# Patient Record
Sex: Male | Born: 1980 | Race: Black or African American | Hispanic: No | Marital: Single | Smoking: Never smoker
Health system: Southern US, Community
[De-identification: ages and names within clinical notes are randomized; demographics above are authoritative.]

## PROBLEM LIST (undated history)

## (undated) DIAGNOSIS — J45909 Unspecified asthma, uncomplicated: Secondary | ICD-10-CM

## (undated) DIAGNOSIS — E119 Type 2 diabetes mellitus without complications: Secondary | ICD-10-CM

## (undated) DIAGNOSIS — I10 Essential (primary) hypertension: Secondary | ICD-10-CM

---

## 2007-05-12 ENCOUNTER — Encounter: Admission: RE | Admit: 2007-05-12 | Discharge: 2007-05-12 | Payer: Self-pay | Admitting: Family Medicine

## 2008-10-01 ENCOUNTER — Ambulatory Visit (HOSPITAL_COMMUNITY): Admission: RE | Admit: 2008-10-01 | Discharge: 2008-10-01 | Payer: Self-pay | Admitting: Family Medicine

## 2010-09-10 ENCOUNTER — Encounter: Payer: Self-pay | Admitting: Family Medicine

## 2021-06-04 DIAGNOSIS — U071 COVID-19: Secondary | ICD-10-CM | POA: Diagnosis not present

## 2021-06-04 DIAGNOSIS — Z1159 Encounter for screening for other viral diseases: Secondary | ICD-10-CM | POA: Diagnosis not present

## 2021-08-07 DIAGNOSIS — F4323 Adjustment disorder with mixed anxiety and depressed mood: Secondary | ICD-10-CM | POA: Diagnosis not present

## 2021-08-15 DIAGNOSIS — N529 Male erectile dysfunction, unspecified: Secondary | ICD-10-CM | POA: Diagnosis not present

## 2021-08-15 DIAGNOSIS — E119 Type 2 diabetes mellitus without complications: Secondary | ICD-10-CM | POA: Diagnosis not present

## 2021-08-15 DIAGNOSIS — I10 Essential (primary) hypertension: Secondary | ICD-10-CM | POA: Diagnosis not present

## 2021-08-15 DIAGNOSIS — E291 Testicular hypofunction: Secondary | ICD-10-CM | POA: Diagnosis not present

## 2021-08-24 DIAGNOSIS — N529 Male erectile dysfunction, unspecified: Secondary | ICD-10-CM | POA: Diagnosis not present

## 2021-08-24 DIAGNOSIS — I10 Essential (primary) hypertension: Secondary | ICD-10-CM | POA: Diagnosis not present

## 2021-08-24 DIAGNOSIS — E119 Type 2 diabetes mellitus without complications: Secondary | ICD-10-CM | POA: Diagnosis not present

## 2021-08-24 DIAGNOSIS — E291 Testicular hypofunction: Secondary | ICD-10-CM | POA: Diagnosis not present

## 2021-09-26 DIAGNOSIS — E291 Testicular hypofunction: Secondary | ICD-10-CM | POA: Diagnosis not present

## 2021-09-26 DIAGNOSIS — E559 Vitamin D deficiency, unspecified: Secondary | ICD-10-CM | POA: Diagnosis not present

## 2021-09-26 DIAGNOSIS — N529 Male erectile dysfunction, unspecified: Secondary | ICD-10-CM | POA: Diagnosis not present

## 2021-09-26 DIAGNOSIS — E119 Type 2 diabetes mellitus without complications: Secondary | ICD-10-CM | POA: Diagnosis not present

## 2021-10-04 DIAGNOSIS — E1165 Type 2 diabetes mellitus with hyperglycemia: Secondary | ICD-10-CM | POA: Diagnosis not present

## 2021-10-09 ENCOUNTER — Other Ambulatory Visit: Payer: Self-pay | Admitting: Family Medicine

## 2021-10-10 ENCOUNTER — Other Ambulatory Visit: Payer: Self-pay | Admitting: Family Medicine

## 2021-10-10 DIAGNOSIS — E221 Hyperprolactinemia: Secondary | ICD-10-CM

## 2021-10-17 ENCOUNTER — Ambulatory Visit
Admission: RE | Admit: 2021-10-17 | Discharge: 2021-10-17 | Disposition: A | Discharge status: Home / Self Care | Payer: Self-pay | Source: Ambulatory Visit | Attending: Family Medicine | Admitting: Family Medicine

## 2021-10-17 ENCOUNTER — Other Ambulatory Visit: Payer: Self-pay

## 2021-10-17 DIAGNOSIS — D352 Benign neoplasm of pituitary gland: Secondary | ICD-10-CM | POA: Diagnosis not present

## 2021-10-17 DIAGNOSIS — E221 Hyperprolactinemia: Secondary | ICD-10-CM

## 2021-10-17 DIAGNOSIS — E237 Disorder of pituitary gland, unspecified: Secondary | ICD-10-CM | POA: Diagnosis not present

## 2021-10-17 MED ORDER — GADOBENATE DIMEGLUMINE 529 MG/ML IV SOLN
10.0000 mL | Freq: Once | INTRAVENOUS | Status: AC | PRN
  Administered 2021-10-17: 10 mL via INTRAVENOUS

## 2021-10-22 ENCOUNTER — Other Ambulatory Visit: Payer: Self-pay

## 2021-10-22 ENCOUNTER — Emergency Department
Admission: EM | Admit: 2021-10-22 | Discharge: 2021-10-22 | Disposition: A | Discharge status: Home / Self Care | Payer: BC Managed Care – PPO | Source: Home / Self Care | Attending: Family Medicine | Admitting: Family Medicine

## 2021-10-22 ENCOUNTER — Emergency Department (INDEPENDENT_AMBULATORY_CARE_PROVIDER_SITE_OTHER): Payer: BC Managed Care – PPO

## 2021-10-22 ENCOUNTER — Encounter: Payer: Self-pay | Admitting: Emergency Medicine

## 2021-10-22 DIAGNOSIS — R0781 Pleurodynia: Secondary | ICD-10-CM

## 2021-10-22 DIAGNOSIS — S20212A Contusion of left front wall of thorax, initial encounter: Secondary | ICD-10-CM

## 2021-10-22 DIAGNOSIS — R051 Acute cough: Secondary | ICD-10-CM | POA: Diagnosis not present

## 2021-10-22 DIAGNOSIS — W19XXXA Unspecified fall, initial encounter: Secondary | ICD-10-CM | POA: Diagnosis not present

## 2021-10-22 HISTORY — DX: Essential (primary) hypertension: I10

## 2021-10-22 HISTORY — DX: Unspecified asthma, uncomplicated: J45.909

## 2021-10-22 HISTORY — DX: Type 2 diabetes mellitus without complications: E11.9

## 2021-10-22 MED ORDER — IBUPROFEN 800 MG PO TABS: 800.0000 mg | ORAL_TABLET | Freq: Three times a day (TID) | ORAL | 0 refills | Status: DC

## 2021-10-22 MED ORDER — BENZONATATE 200 MG PO CAPS: 200.0000 mg | ORAL_CAPSULE | Freq: Three times a day (TID) | ORAL | 0 refills | Status: DC | PRN

## 2021-10-22 NOTE — ED Provider Notes (Signed)
?[facility] ? ? ? ?CSN: [id number] ?Arrival date & time: 10/22/21  1309 ? ? ?  ? ?History   ?Chief Complaint ?Chief Complaint  ?Patient presents with  ? Abdominal Pain  ? ? ?HPI ?Steven Stevenson is a 41 y.o. male.  ? ?HPI ?Patient fell while skiing.  He states that his left arm was across the front of his chest, elbow at 90 degrees, and as he felt he landed right on the left arm that pressed into his rib cage.  Ever since then he has had rib pain.  It hurts with deep breath.  Hurts with cough.  Hurts with certain movement.  He states he is worried about fracture.  Its not gotten better.  Is been like this about a week.  He has developed a cough.  He states the cough predated the fall.  No fever chills, nausea vomiting, no sputum production, no fatigue ? ?Past Medical History:  ?Diagnosis Date  ? Asthma   ? Diabetes mellitus without complication (HCC)   ? Hypertension   ? ? ?There are no problems to display for this patient. ? ? ?History reviewed. No pertinent surgical history. ? ? ? ? ?Home Medications   ? ?Prior to Admission medications   ?Medication Sig Start Date End Date Taking? Authorizing Provider  ?albuterol (VENTOLIN HFA) 108 (90 Base) MCG/ACT inhaler Inhale into the lungs. 04/03/19  Yes [provider]  ?amLODipine (NORVASC) 10 MG tablet Take 10 mg by mouth daily. 08/23/21  Yes [provider]  ?atorvastatin (LIPITOR) 10 MG tablet Take 10 mg by mouth daily. 10/22/21  Yes [provider]  ?benzonatate (TESSALON) 200 MG capsule Take 1 capsule (200 mg total) by mouth 3 (three) times daily as needed for cough. 10/22/21  Yes Eustace Moore, MD  ?Continuous Blood Gluc Sensor (DEXCOM G7 SENSOR) MISC USE AS DIRECTED MORE THAN 4 TIMES DAILY TO CHECK BLOOD SUGAR 10/09/21  Yes [provider]  ?ergocalciferol (VITAMIN D2) 1.25 MG (50000 UT) capsule Take by mouth. 08/30/21  Yes [provider]  ?FARXIGA 5 MG TABS tablet Take 5 mg by mouth daily. 10/15/21  Yes [provider]  ?glipiZIDE (GLUCOTROL) 5 MG tablet Take 1-2 tablets by mouth every morning. 09/26/21  Yes [provider]  ?ibuprofen (ADVIL) 800 MG tablet Take 1 tablet (800 mg total) by mouth 3 (three) times daily. 10/22/21  Yes Eustace Moore, MD  ?JANUVIA 100 MG tablet Take 100 mg by mouth daily. 10/15/21  Yes [provider]  ?lisinopril (ZESTRIL) 40 MG tablet Take 40 mg by mouth daily. 08/30/21  Yes [provider]  ?metFORMIN (GLUCOPHAGE-XR) 500 MG 24 hr tablet SMARTSIG:2 Tablet(s) By Mouth Morning-Evening 07/25/21  Yes [provider]  ?metoprolol succinate (TOPROL-XL) 50 MG 24 hr tablet Take by mouth. 08/30/21  Yes [provider]  ?montelukast (SINGULAIR) 10 MG tablet Take by mouth. 01/26/19  Yes [provider]  ?omeprazole (PRILOSEC) 40 MG capsule Take 1 capsule by mouth daily. 10/29/19  Yes [provider]  ?sildenafil (VIAGRA) 100 MG tablet SMARTSIG:0.5-1 Tablet(s) By Mouth As Directed 08/23/21  Yes [provider]  ? ? ?Family History ?Family History  ?Problem Relation Age of Onset  ? Hypertension Father   ? Diabetes Father   ? ? ?Social History ?Social History  ? ?Tobacco Use  ? Smoking status: Never  ? Smokeless tobacco: Never  ?Vaping Use  ? Vaping Use: Never used  ?Substance Use Topics  ? Alcohol  use: Yes  ? Drug use: Not Currently  ? ? ? ?Allergies   ?Penicillins ? ? ?Review of Systems ?Review of Systems ?See HPI ? ?Physical Exam ?Triage Vital Signs ?ED Triage Vitals  ?Enc Vitals Group  ?   BP 10/22/21 1346 118/87  ?   Pulse Rate 10/22/21 1346 83  ?   Resp 10/22/21 1346 18  ?   Temp 10/22/21 1346 98.5 ?F (36.9 ?C)  ?   Temp Source 10/22/21 1346 Oral  ?   SpO2 10/22/21 1346 96 %  ?   Weight --   ?   Height --   ?   Head Circumference --   ?   Peak Flow --   ?   Pain Score 10/22/21 1339 8  ?   Pain Loc --   ?   Pain Edu? --   ?   Excl. in GC? --   ? ?No data found. ? ?Updated Vital Signs ?BP 118/87 (BP Location: Left Arm)   Pulse 83    Temp 98.5 ?F (36.9 ?C) (Oral)   Resp 18   SpO2 96%  ?   ? ?Physical Exam ?Constitutional:   ?   General: He is not in acute distress. ?   Appearance: He is well-developed. He is not ill-appearing.  ?HENT:  ?   Head: Normocephalic and atraumatic.  ?   Nose:  ?   Comments: Mask is in place ?Eyes:  ?   Conjunctiva/sclera: Conjunctivae normal.  ?   Pupils: Pupils are equal, round, and reactive to light.  ?Cardiovascular:  ?   Rate and Rhythm: Normal rate.  ?Pulmonary:  ?   Effort: Pulmonary effort is normal. No respiratory distress.  ?Chest:  ? ? ?Abdominal:  ?   General: There is no distension.  ?   Palpations: Abdomen is soft.  ?Musculoskeletal:     ?   General: Normal range of motion.  ?   Cervical back: Normal range of motion.  ?Skin: ?   General: Skin is warm and dry.  ?Neurological:  ?   Mental Status: He is alert.  ? ? ? ?UC Treatments / Results  ?Labs ?(all labs ordered are listed, but only abnormal results are displayed) ?Labs Reviewed - No data to display ? ?EKG ? ? ?Radiology ?DG Ribs Unilateral W/Chest Left ? ?Result Date: 10/22/2021 ?CLINICAL DATA:  Left anterior lower rib pain after fall, increased pain with movement EXAM: LEFT RIBS AND CHEST - 3+ VIEW COMPARISON:  None. FINDINGS: No fracture or other bone lesions are seen involving the ribs. There is no evidence of pneumothorax or pleural effusion. Both lungs are clear. Heart size and mediastinal contours are within normal limits. IMPRESSION: Negative. Electronically Signed   By: Amie Portland M.D.   On: 10/22/2021 14:35   ? ?Procedures ?Procedures (including critical care time) ? ?Medications Ordered in UC ?Medications - No data to display ? ?Initial Impression / Assessment and Plan / UC Course  ?I have reviewed the triage vital signs and the nursing notes. ? ?Pertinent labs & imaging results that were available during my care of the patient were reviewed by me and considered in my medical decision making (see chart for details). ? ?  ? ?Final Clinical  Impressions(s) / UC Diagnoses  ? ?Final diagnoses:  ?Acute cough  ?Chest wall contusion, left, initial encounter  ? ? ? ?Discharge Instructions   ? ?  ?Take ibuprofen 3 times a day with food.  This is an  anti-inflammatory to reduce the rib pain and inflammation ?I have prescribed Tessalon to help with the cough.  You may take this 2-3 times a day ?Return as needed ? ? ?ED Prescriptions   ? ? Medication Sig Dispense Auth. Provider  ? benzonatate (TESSALON) 200 MG capsule Take 1 capsule (200 mg total) by mouth 3 (three) times daily as needed for cough. 21 capsule Eustace Moore, MD  ? ibuprofen (ADVIL) 800 MG tablet Take 1 tablet (800 mg total) by mouth 3 (three) times daily. 21 tablet Eustace Moore, MD  ? ?  ? ?PDMP not reviewed this encounter. ?  ?Eustace Moore, MD ?10/22/21 1510 ? ?

## 2021-10-22 NOTE — ED Triage Notes (Signed)
Patient presents to Urgent Care with complaints of left upper abdominal pain since 1 week ago. Patient reports going skiing and fell. When he landed on the his left upper abdomen. No pain with deep breathe. Coughing does make pain worst. Has not tried any medication.  ?

## 2021-10-22 NOTE — Discharge Instructions (Signed)
Take ibuprofen 3 times a day with food.  This is an anti-inflammatory to reduce the rib pain and inflammation ?I have prescribed Tessalon to help with the cough.  You may take this 2-3 times a day ?Return as needed ?

## 2021-11-07 DIAGNOSIS — E1165 Type 2 diabetes mellitus with hyperglycemia: Secondary | ICD-10-CM | POA: Diagnosis not present

## 2021-11-07 DIAGNOSIS — Z7984 Long term (current) use of oral hypoglycemic drugs: Secondary | ICD-10-CM | POA: Diagnosis not present

## 2021-11-07 DIAGNOSIS — E221 Hyperprolactinemia: Secondary | ICD-10-CM | POA: Diagnosis not present

## 2021-11-07 DIAGNOSIS — E1140 Type 2 diabetes mellitus with diabetic neuropathy, unspecified: Secondary | ICD-10-CM | POA: Diagnosis not present

## 2021-11-07 DIAGNOSIS — D352 Benign neoplasm of pituitary gland: Secondary | ICD-10-CM | POA: Diagnosis not present

## 2021-11-24 DIAGNOSIS — E1165 Type 2 diabetes mellitus with hyperglycemia: Secondary | ICD-10-CM | POA: Diagnosis not present

## 2021-11-24 DIAGNOSIS — E229 Hyperfunction of pituitary gland, unspecified: Secondary | ICD-10-CM | POA: Diagnosis not present

## 2021-11-24 DIAGNOSIS — D352 Benign neoplasm of pituitary gland: Secondary | ICD-10-CM | POA: Diagnosis not present

## 2021-11-30 DIAGNOSIS — E1165 Type 2 diabetes mellitus with hyperglycemia: Secondary | ICD-10-CM | POA: Diagnosis not present

## 2021-11-30 DIAGNOSIS — D352 Benign neoplasm of pituitary gland: Secondary | ICD-10-CM | POA: Diagnosis not present

## 2021-11-30 DIAGNOSIS — E221 Hyperprolactinemia: Secondary | ICD-10-CM | POA: Diagnosis not present

## 2021-11-30 DIAGNOSIS — E785 Hyperlipidemia, unspecified: Secondary | ICD-10-CM | POA: Diagnosis not present

## 2021-12-26 DIAGNOSIS — L649 Androgenic alopecia, unspecified: Secondary | ICD-10-CM | POA: Diagnosis not present

## 2021-12-26 DIAGNOSIS — L219 Seborrheic dermatitis, unspecified: Secondary | ICD-10-CM | POA: Diagnosis not present

## 2022-01-26 DIAGNOSIS — D352 Benign neoplasm of pituitary gland: Secondary | ICD-10-CM | POA: Diagnosis not present

## 2022-01-26 DIAGNOSIS — E229 Hyperfunction of pituitary gland, unspecified: Secondary | ICD-10-CM | POA: Diagnosis not present

## 2022-02-08 DIAGNOSIS — E785 Hyperlipidemia, unspecified: Secondary | ICD-10-CM | POA: Diagnosis not present

## 2022-02-08 DIAGNOSIS — E221 Hyperprolactinemia: Secondary | ICD-10-CM | POA: Diagnosis not present

## 2022-02-08 DIAGNOSIS — E1165 Type 2 diabetes mellitus with hyperglycemia: Secondary | ICD-10-CM | POA: Diagnosis not present

## 2022-02-08 DIAGNOSIS — D352 Benign neoplasm of pituitary gland: Secondary | ICD-10-CM | POA: Diagnosis not present

## 2022-03-07 DIAGNOSIS — R0683 Snoring: Secondary | ICD-10-CM | POA: Diagnosis not present

## 2022-03-07 DIAGNOSIS — J4520 Mild intermittent asthma, uncomplicated: Secondary | ICD-10-CM | POA: Diagnosis not present

## 2022-03-07 DIAGNOSIS — G4710 Hypersomnia, unspecified: Secondary | ICD-10-CM | POA: Diagnosis not present

## 2022-04-30 DIAGNOSIS — D352 Benign neoplasm of pituitary gland: Secondary | ICD-10-CM | POA: Diagnosis not present

## 2022-04-30 DIAGNOSIS — E229 Hyperfunction of pituitary gland, unspecified: Secondary | ICD-10-CM | POA: Diagnosis not present

## 2022-04-30 DIAGNOSIS — L649 Androgenic alopecia, unspecified: Secondary | ICD-10-CM | POA: Diagnosis not present

## 2022-04-30 DIAGNOSIS — L219 Seborrheic dermatitis, unspecified: Secondary | ICD-10-CM | POA: Diagnosis not present

## 2022-06-11 DIAGNOSIS — R3129 Other microscopic hematuria: Secondary | ICD-10-CM | POA: Diagnosis not present

## 2022-06-11 DIAGNOSIS — E1129 Type 2 diabetes mellitus with other diabetic kidney complication: Secondary | ICD-10-CM | POA: Diagnosis not present

## 2022-06-11 DIAGNOSIS — I10 Essential (primary) hypertension: Secondary | ICD-10-CM | POA: Diagnosis not present

## 2022-06-11 DIAGNOSIS — R809 Proteinuria, unspecified: Secondary | ICD-10-CM | POA: Diagnosis not present

## 2022-07-03 DIAGNOSIS — D352 Benign neoplasm of pituitary gland: Secondary | ICD-10-CM | POA: Diagnosis not present

## 2022-07-03 DIAGNOSIS — E1165 Type 2 diabetes mellitus with hyperglycemia: Secondary | ICD-10-CM | POA: Diagnosis not present

## 2022-07-03 DIAGNOSIS — G43909 Migraine, unspecified, not intractable, without status migrainosus: Secondary | ICD-10-CM | POA: Diagnosis not present

## 2022-07-03 DIAGNOSIS — E229 Hyperfunction of pituitary gland, unspecified: Secondary | ICD-10-CM | POA: Diagnosis not present

## 2022-07-26 ENCOUNTER — Ambulatory Visit
Admission: EM | Admit: 2022-07-26 | Discharge: 2022-07-26 | Disposition: A | Discharge status: Home / Self Care | Payer: BC Managed Care – PPO

## 2022-07-26 ENCOUNTER — Ambulatory Visit (INDEPENDENT_AMBULATORY_CARE_PROVIDER_SITE_OTHER): Payer: BC Managed Care – PPO

## 2022-07-26 DIAGNOSIS — G4486 Cervicogenic headache: Secondary | ICD-10-CM | POA: Diagnosis not present

## 2022-07-26 DIAGNOSIS — M545 Low back pain, unspecified: Secondary | ICD-10-CM

## 2022-07-26 DIAGNOSIS — S139XXA Sprain of joints and ligaments of unspecified parts of neck, initial encounter: Secondary | ICD-10-CM | POA: Diagnosis not present

## 2022-07-26 DIAGNOSIS — M542 Cervicalgia: Secondary | ICD-10-CM | POA: Diagnosis not present

## 2022-07-26 MED ORDER — LIDOCAINE 4 % EX PTCH: 1.0000 | MEDICATED_PATCH | CUTANEOUS | 0 refills | Status: AC

## 2022-07-26 MED ORDER — METHOCARBAMOL 500 MG PO TABS: 500.0000 mg | ORAL_TABLET | Freq: Two times a day (BID) | ORAL | 0 refills | Status: DC

## 2022-07-26 MED ORDER — NAPROXEN 375 MG PO TABS: 375.0000 mg | ORAL_TABLET | Freq: Two times a day (BID) | ORAL | 0 refills | Status: DC
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
## 2022-07-26 NOTE — Discharge Instructions (Addendum)
Your x-rays were normal with no evidence of fracture.  I suspect that you have muscle strain as the cause of your symptoms.  Start Naprosyn twice daily.  Do not take NSAIDs with this medication including aspirin, ibuprofen/Advil, naproxen/Aleve.  You can use acetaminophen/Tylenol for additional pain.  Take Robaxin up to twice a day for muscle pain.  This will make you sleepy so do not drive or drink alcohol while taking it.  You can use lidocaine patches on specific areas.  Use 1 patch for 12 hours and then remove it for 12 hours (you should only use 1 patch per 24 hours).  I think you should follow-up with sports medicine as you would likely benefit from physical therapy and other interventions.  Please call them to schedule an appointment.  If you develop any worsening symptoms including increased pain, severe headache, weakness, numbness or tingling sensation in your arms or legs, nausea, vomiting, going to the bathroom on yourself without noticing it you need to be seen immediately.

## 2022-08-06 DIAGNOSIS — E113213 Type 2 diabetes mellitus with mild nonproliferative diabetic retinopathy with macular edema, bilateral: Secondary | ICD-10-CM | POA: Diagnosis not present

## 2022-09-21 ENCOUNTER — Ambulatory Visit (INDEPENDENT_AMBULATORY_CARE_PROVIDER_SITE_OTHER): Payer: BC Managed Care – PPO | Admitting: Sports Medicine

## 2022-09-21 DIAGNOSIS — G8929 Other chronic pain: Secondary | ICD-10-CM

## 2022-09-21 DIAGNOSIS — M542 Cervicalgia: Secondary | ICD-10-CM

## 2022-09-21 DIAGNOSIS — M5030 Other cervical disc degeneration, unspecified cervical region: Secondary | ICD-10-CM | POA: Insufficient documentation

## 2022-09-21 MED ORDER — MELOXICAM 15 MG PO TABS: ORAL_TABLET | ORAL | 3 refills | Status: DC
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
## 2022-10-09 DIAGNOSIS — E1165 Type 2 diabetes mellitus with hyperglycemia: Secondary | ICD-10-CM | POA: Diagnosis not present

## 2022-10-09 DIAGNOSIS — D352 Benign neoplasm of pituitary gland: Secondary | ICD-10-CM | POA: Diagnosis not present

## 2022-10-09 DIAGNOSIS — Z7984 Long term (current) use of oral hypoglycemic drugs: Secondary | ICD-10-CM | POA: Diagnosis not present

## 2022-10-09 DIAGNOSIS — E221 Hyperprolactinemia: Secondary | ICD-10-CM | POA: Diagnosis not present

## 2022-10-09 DIAGNOSIS — Z7985 Long-term (current) use of injectable non-insulin antidiabetic drugs: Secondary | ICD-10-CM | POA: Diagnosis not present

## 2022-10-09 DIAGNOSIS — E1140 Type 2 diabetes mellitus with diabetic neuropathy, unspecified: Secondary | ICD-10-CM | POA: Diagnosis not present

## 2022-10-21 ENCOUNTER — Ambulatory Visit (INDEPENDENT_AMBULATORY_CARE_PROVIDER_SITE_OTHER): Payer: BC Managed Care – PPO

## 2022-10-21 DIAGNOSIS — M542 Cervicalgia: Secondary | ICD-10-CM

## 2022-10-21 DIAGNOSIS — G8929 Other chronic pain: Secondary | ICD-10-CM | POA: Diagnosis not present

## 2022-10-21 DIAGNOSIS — M4802 Spinal stenosis, cervical region: Secondary | ICD-10-CM | POA: Diagnosis not present

## 2022-10-25 ENCOUNTER — Encounter: Payer: Self-pay | Admitting: Physical Therapy

## 2022-10-25 ENCOUNTER — Ambulatory Visit: Payer: BC Managed Care – PPO | Attending: Sports Medicine | Admitting: Physical Therapy

## 2022-10-25 ENCOUNTER — Other Ambulatory Visit: Payer: Self-pay

## 2022-10-25 DIAGNOSIS — R293 Abnormal posture: Secondary | ICD-10-CM

## 2022-10-25 DIAGNOSIS — M546 Pain in thoracic spine: Secondary | ICD-10-CM | POA: Diagnosis not present

## 2022-10-25 DIAGNOSIS — M542 Cervicalgia: Secondary | ICD-10-CM | POA: Insufficient documentation

## 2022-10-25 DIAGNOSIS — G8929 Other chronic pain: Secondary | ICD-10-CM | POA: Diagnosis not present

## 2022-10-25 DIAGNOSIS — M5459 Other low back pain: Secondary | ICD-10-CM | POA: Insufficient documentation

## 2022-10-25 DIAGNOSIS — E229 Hyperfunction of pituitary gland, unspecified: Secondary | ICD-10-CM | POA: Diagnosis not present

## 2022-10-25 DIAGNOSIS — D352 Benign neoplasm of pituitary gland: Secondary | ICD-10-CM | POA: Diagnosis not present
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
## 2022-10-29 DIAGNOSIS — L649 Androgenic alopecia, unspecified: Secondary | ICD-10-CM | POA: Diagnosis not present

## 2022-11-02 ENCOUNTER — Ambulatory Visit (INDEPENDENT_AMBULATORY_CARE_PROVIDER_SITE_OTHER): Payer: BC Managed Care – PPO | Admitting: Sports Medicine

## 2022-11-02 DIAGNOSIS — M542 Cervicalgia: Secondary | ICD-10-CM

## 2022-11-02 DIAGNOSIS — G8929 Other chronic pain: Secondary | ICD-10-CM | POA: Diagnosis not present

## 2022-11-02 MED ORDER — GABAPENTIN 300 MG PO CAPS: ORAL_CAPSULE | ORAL | 3 refills | Status: AC

## 2022-11-02 NOTE — Assessment & Plan Note (Signed)
Pleasant 42 year old male, chronic axial neck pain. He had been seen in urgent care, he had some muscle relaxers, no improvement, we added meloxicam, marginal changes, he is currently doing physical therapy, pain is down to a 6/10. Most of his discomfort is at night, for this reason we will discontinue meloxicam and add Neurontin at night in an up taper. I would also like PT to add dry needling to the regimen and I like to see him back in about 6 weeks.

## 2022-11-02 NOTE — Progress Notes (Signed)
    Procedures performed today:    None.  Independent interpretation of notes and tests performed by another provider:   None.  Brief History, Exam, Impression, and Recommendations:    Chronic neck pain Pleasant 42 year old male, chronic axial neck pain. He had been seen in urgent care, he had some muscle relaxers, no improvement, we added meloxicam, marginal changes, he is currently doing physical therapy, pain is down to a 6/10. Most of his discomfort is at night, for this reason we will discontinue meloxicam and add Neurontin at night in an up taper. I would also like PT to add dry needling to the regimen and I like to see him back in about 6 weeks.  Chronic process with exacerbation and pharmacologic intervention  ____________________________________________ Gwen Her. Dianah Field, M.D., ABFM., CAQSM., AME. Primary Care and Sports Medicine [facility]  Adjunct Professor of [place] [employer] [facility] [employer]  Risk manager

## 2022-11-27 ENCOUNTER — Encounter: Payer: Self-pay | Admitting: Physical Therapy

## 2022-11-27 ENCOUNTER — Ambulatory Visit: Payer: BC Managed Care – PPO | Attending: Sports Medicine | Admitting: Physical Therapy

## 2022-11-27 DIAGNOSIS — M5459 Other low back pain: Secondary | ICD-10-CM | POA: Insufficient documentation

## 2022-11-27 DIAGNOSIS — M546 Pain in thoracic spine: Secondary | ICD-10-CM

## 2022-11-27 DIAGNOSIS — R293 Abnormal posture: Secondary | ICD-10-CM | POA: Diagnosis present

## 2022-11-27 DIAGNOSIS — M542 Cervicalgia: Secondary | ICD-10-CM | POA: Insufficient documentation
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
## 2022-11-29 ENCOUNTER — Ambulatory Visit: Payer: BC Managed Care – PPO | Admitting: Physical Therapy

## 2022-11-29 DIAGNOSIS — D352 Benign neoplasm of pituitary gland: Secondary | ICD-10-CM | POA: Diagnosis not present

## 2022-11-29 DIAGNOSIS — E229 Hyperfunction of pituitary gland, unspecified: Secondary | ICD-10-CM | POA: Diagnosis not present

## 2022-12-17 ENCOUNTER — Ambulatory Visit (INDEPENDENT_AMBULATORY_CARE_PROVIDER_SITE_OTHER): Payer: BC Managed Care – PPO | Admitting: Sports Medicine

## 2022-12-17 DIAGNOSIS — M5030 Other cervical disc degeneration, unspecified cervical region: Secondary | ICD-10-CM

## 2022-12-17 NOTE — Progress Notes (Signed)
    Procedures performed today:    None.  Independent interpretation of notes and tests performed by another provider:   None.  Brief History, Exam, Impression, and Recommendations:    DDD (degenerative disc disease), cervical Pleasant 42 year old male, chronic axial neck pain, improved with physical therapy, dry needling, meloxicam and gabapentin. He will continue all of the above, he can return to see me as needed.    ____________________________________________ Ihor Austin. Benjamin Stain, M.D., ABFM., CAQSM., AME. Primary Care and Sports Medicine [facility]  Adjunct Professor of Family Medicine  [employer] [facility] [employer]  Restaurant manager, fast food

## 2022-12-17 NOTE — Assessment & Plan Note (Signed)
Pleasant 42 year old male, chronic axial neck pain, improved with physical therapy, dry needling, meloxicam and gabapentin. He will continue all of the above, he can return to see me as needed.

## 2022-12-19 ENCOUNTER — Encounter: Payer: Self-pay | Admitting: Physical Therapy

## 2022-12-19 ENCOUNTER — Ambulatory Visit: Payer: BC Managed Care – PPO | Attending: Sports Medicine | Admitting: Physical Therapy

## 2022-12-19 DIAGNOSIS — M542 Cervicalgia: Secondary | ICD-10-CM | POA: Insufficient documentation

## 2022-12-19 DIAGNOSIS — R293 Abnormal posture: Secondary | ICD-10-CM | POA: Insufficient documentation

## 2022-12-19 DIAGNOSIS — M5459 Other low back pain: Secondary | ICD-10-CM | POA: Diagnosis present

## 2022-12-19 DIAGNOSIS — M546 Pain in thoracic spine: Secondary | ICD-10-CM | POA: Insufficient documentation
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
## 2022-12-26 ENCOUNTER — Ambulatory Visit: Payer: BC Managed Care – PPO | Admitting: Physical Therapy

## 2022-12-26 ENCOUNTER — Encounter: Payer: Self-pay | Admitting: Physical Therapy

## 2022-12-26 DIAGNOSIS — M5459 Other low back pain: Secondary | ICD-10-CM | POA: Diagnosis not present

## 2022-12-26 DIAGNOSIS — M546 Pain in thoracic spine: Secondary | ICD-10-CM

## 2022-12-26 DIAGNOSIS — M542 Cervicalgia: Secondary | ICD-10-CM

## 2022-12-26 DIAGNOSIS — R293 Abnormal posture: Secondary | ICD-10-CM
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
## 2023-01-03 ENCOUNTER — Ambulatory Visit: Payer: BC Managed Care – PPO | Admitting: Physical Therapy

## 2023-01-03 ENCOUNTER — Encounter: Payer: Self-pay | Admitting: Physical Therapy

## 2023-01-03 DIAGNOSIS — M542 Cervicalgia: Secondary | ICD-10-CM

## 2023-01-03 DIAGNOSIS — M5459 Other low back pain: Secondary | ICD-10-CM | POA: Diagnosis not present

## 2023-01-03 DIAGNOSIS — R293 Abnormal posture: Secondary | ICD-10-CM | POA: Diagnosis not present

## 2023-01-03 DIAGNOSIS — M546 Pain in thoracic spine: Secondary | ICD-10-CM
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
## 2023-01-16 ENCOUNTER — Encounter: Payer: Self-pay | Admitting: Physical Therapy

## 2023-01-16 ENCOUNTER — Ambulatory Visit: Payer: BC Managed Care – PPO | Admitting: Physical Therapy

## 2023-01-16 DIAGNOSIS — R293 Abnormal posture: Secondary | ICD-10-CM

## 2023-01-16 DIAGNOSIS — M546 Pain in thoracic spine: Secondary | ICD-10-CM

## 2023-01-16 DIAGNOSIS — M5459 Other low back pain: Secondary | ICD-10-CM | POA: Diagnosis not present

## 2023-01-16 DIAGNOSIS — M542 Cervicalgia: Secondary | ICD-10-CM | POA: Diagnosis not present
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
## 2023-02-05 DIAGNOSIS — Z7985 Long-term (current) use of injectable non-insulin antidiabetic drugs: Secondary | ICD-10-CM | POA: Diagnosis not present

## 2023-02-05 DIAGNOSIS — D352 Benign neoplasm of pituitary gland: Secondary | ICD-10-CM | POA: Diagnosis not present

## 2023-02-05 DIAGNOSIS — E1165 Type 2 diabetes mellitus with hyperglycemia: Secondary | ICD-10-CM | POA: Diagnosis not present

## 2023-02-05 DIAGNOSIS — Z7984 Long term (current) use of oral hypoglycemic drugs: Secondary | ICD-10-CM | POA: Diagnosis not present

## 2023-03-11 DIAGNOSIS — E119 Type 2 diabetes mellitus without complications: Secondary | ICD-10-CM | POA: Diagnosis not present

## 2023-03-11 DIAGNOSIS — L84 Corns and callosities: Secondary | ICD-10-CM | POA: Diagnosis not present

## 2023-03-11 DIAGNOSIS — S90219A Contusion of unspecified great toe with damage to nail, initial encounter: Secondary | ICD-10-CM | POA: Diagnosis not present

## 2023-05-23 DIAGNOSIS — L84 Corns and callosities: Secondary | ICD-10-CM | POA: Diagnosis not present

## 2023-05-23 DIAGNOSIS — E1165 Type 2 diabetes mellitus with hyperglycemia: Secondary | ICD-10-CM | POA: Diagnosis not present

## 2023-06-10 DIAGNOSIS — E1129 Type 2 diabetes mellitus with other diabetic kidney complication: Secondary | ICD-10-CM | POA: Diagnosis not present

## 2023-06-10 DIAGNOSIS — R809 Proteinuria, unspecified: Secondary | ICD-10-CM | POA: Diagnosis not present

## 2023-06-12 DIAGNOSIS — R809 Proteinuria, unspecified: Secondary | ICD-10-CM | POA: Diagnosis not present

## 2023-06-12 DIAGNOSIS — E1129 Type 2 diabetes mellitus with other diabetic kidney complication: Secondary | ICD-10-CM | POA: Diagnosis not present

## 2023-06-12 DIAGNOSIS — M898X9 Other specified disorders of bone, unspecified site: Secondary | ICD-10-CM | POA: Diagnosis not present

## 2023-06-12 DIAGNOSIS — E559 Vitamin D deficiency, unspecified: Secondary | ICD-10-CM | POA: Diagnosis not present

## 2023-09-26 IMAGING — DX DG RIBS W/ CHEST 3+V*L*
4 series · 4 of 4 positions shown · non-contrast
Comparison: None.

CLINICAL DATA: Left anterior lower rib pain after fall, increased
pain with movement

EXAM:
LEFT RIBS AND CHEST - 3+ VIEW

[chest pa]
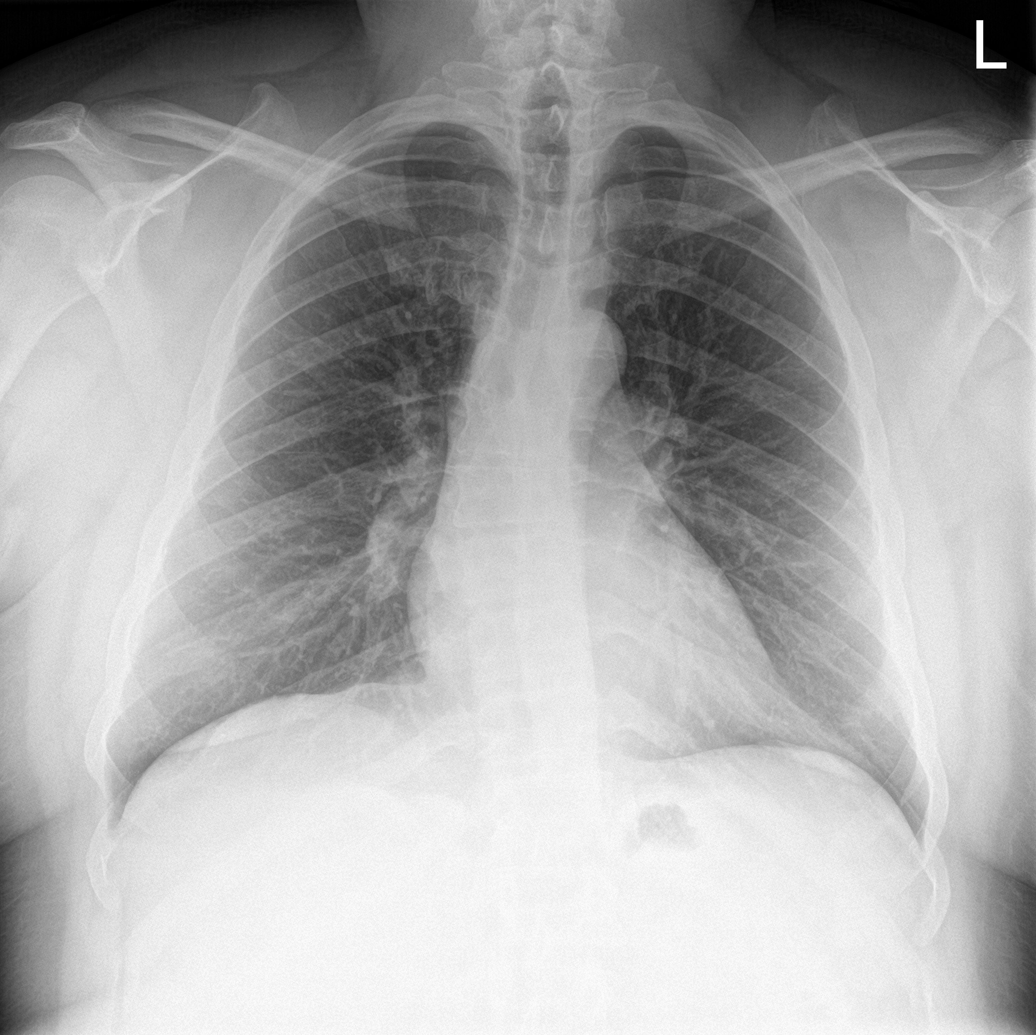

[rib pa]
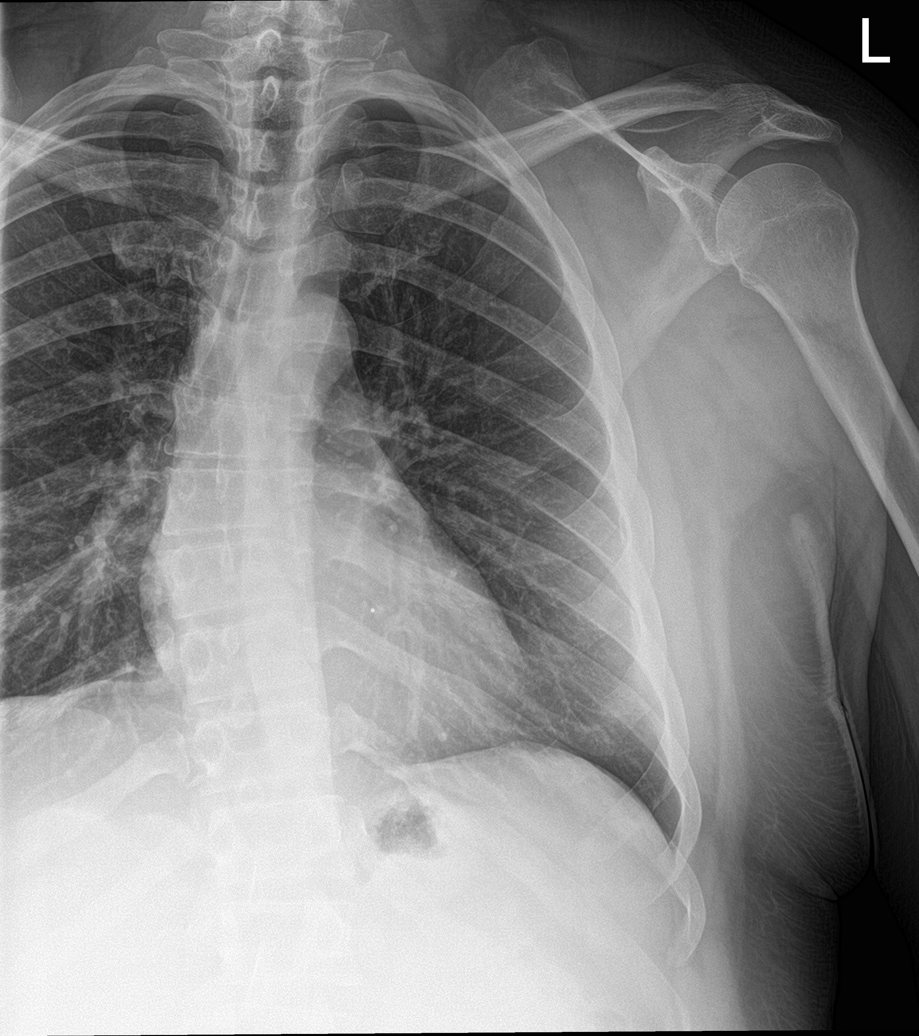

[rib ap]
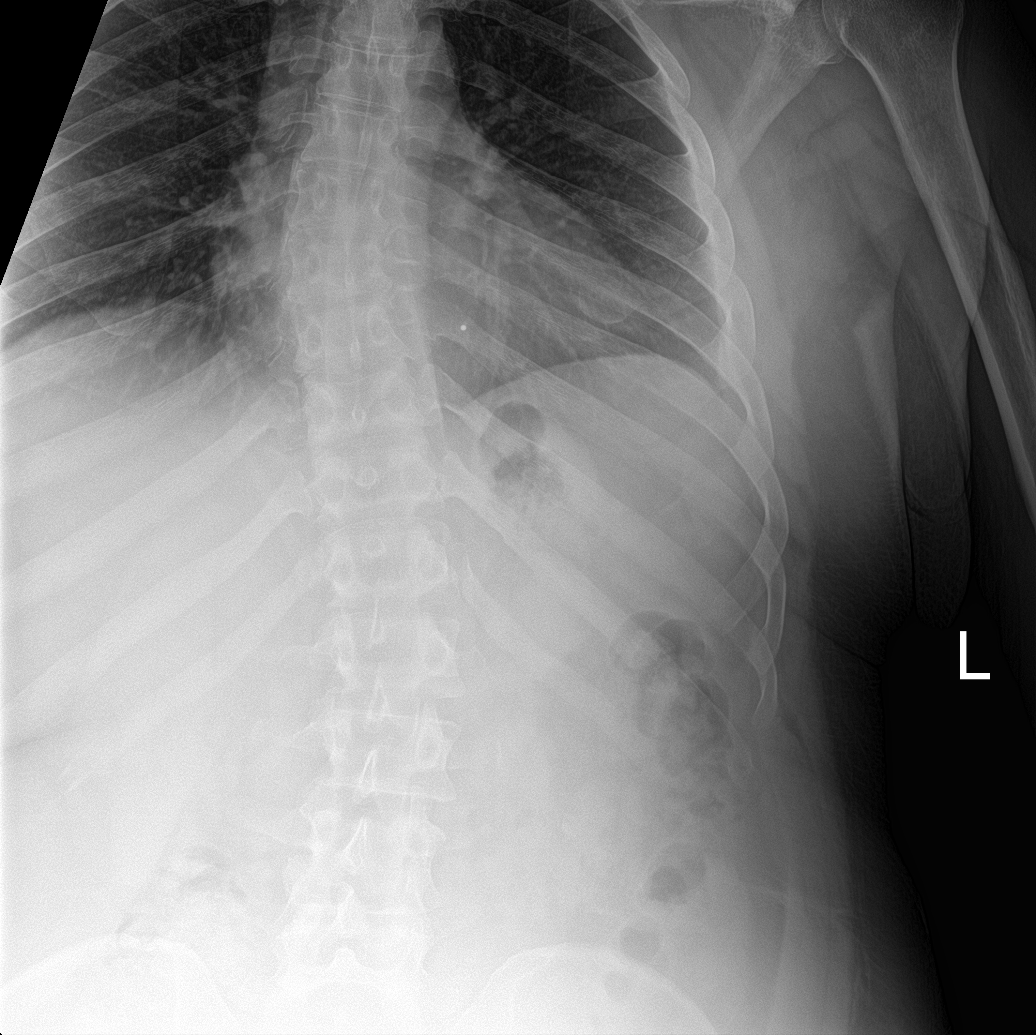

[rib pa obl]
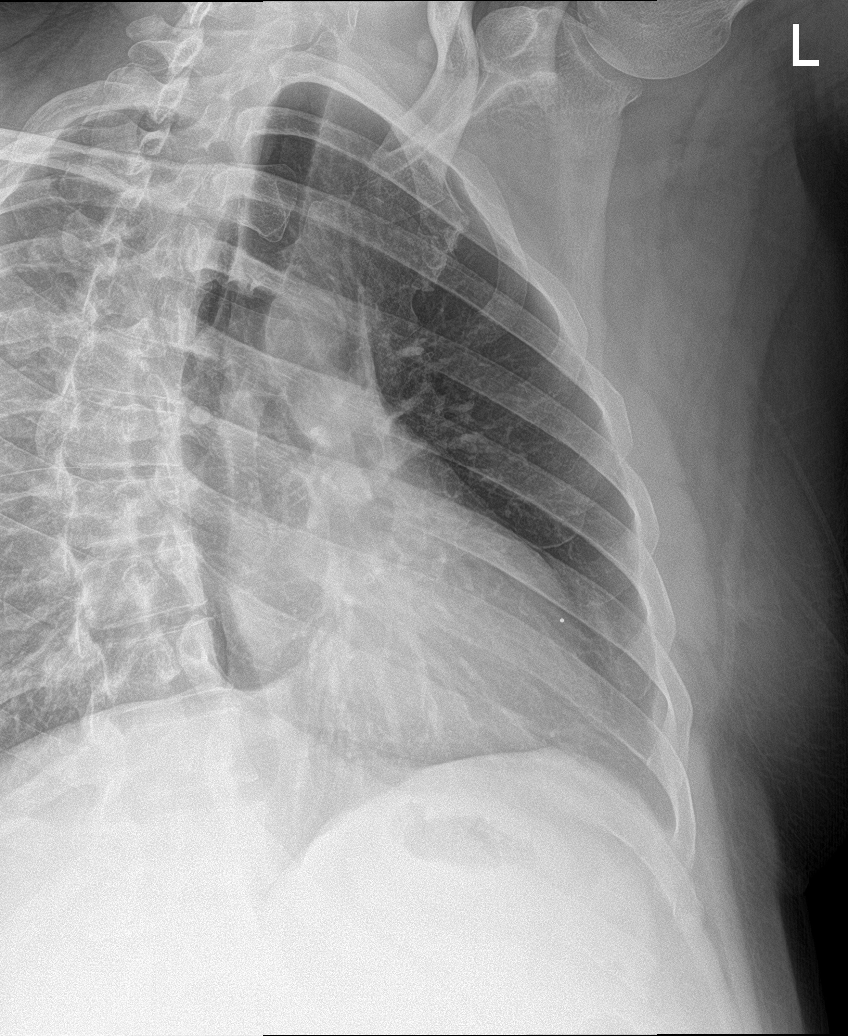

[4 of 4 positions shown; findings below may reference images not displayed]

FINDINGS: No fracture or other bone lesions are seen involving the ribs. There
is no evidence of pneumothorax or pleural effusion. Both lungs are
clear. Heart size and mediastinal contours are within normal limits.
IMPRESSION: Negative.

## 2024-04-16 ENCOUNTER — Telehealth: Payer: Self-pay

## 2024-04-16 NOTE — Telephone Encounter (Signed)
 Ok for note saying he was here on those days for doctors appt

## 2024-04-16 NOTE — Telephone Encounter (Signed)
 Forwarding message to DR. Metheney covering Dr. Curtis
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
## 2024-04-17 ENCOUNTER — Telehealth: Payer: Self-pay | Admitting: Family Medicine
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
## 2024-04-17 NOTE — Telephone Encounter (Signed)
 Letter has been written and reviewed by patient
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
## 2024-04-17 NOTE — Telephone Encounter (Signed)
 Hi Kim, as long as the dates do match up with what we have documented for his PT dates then I think that it is reasonable for him to have a couple of recovery days after each appointment.  Especially if he has a very physical job.

## 2024-04-17 NOTE — Telephone Encounter (Signed)
 Note written and patient informed. He reviewed this while on phone with me as well.

## 2024-04-21 ENCOUNTER — Encounter: Payer: Self-pay | Admitting: Sports Medicine
